# Patient Record
Sex: Male | Born: 1970 | Race: White | Hispanic: No | Marital: Married | Smoking: Never smoker
Health system: Southern US, Community
[De-identification: ages and names within clinical notes are randomized; demographics above are authoritative.]

## PROBLEM LIST (undated history)

## (undated) DIAGNOSIS — K5090 Crohn's disease, unspecified, without complications: Secondary | ICD-10-CM

## (undated) HISTORY — PX: WISDOM TOOTH EXTRACTION: SHX21

## (undated) HISTORY — PX: KNEE SURGERY: SHX244

---

## 2007-06-15 ENCOUNTER — Ambulatory Visit: Payer: Self-pay | Admitting: Emergency Medicine

## 2008-02-26 ENCOUNTER — Ambulatory Visit: Payer: Self-pay | Admitting: Unknown Physician Specialty

## 2009-01-07 ENCOUNTER — Ambulatory Visit: Payer: Self-pay | Admitting: Internal Medicine
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
## 2011-10-23 ENCOUNTER — Ambulatory Visit: Payer: Self-pay | Admitting: Family Medicine

## 2013-06-22 ENCOUNTER — Ambulatory Visit: Payer: Self-pay | Admitting: Physician Assistant

## 2013-06-22 LAB — RAPID STREP-A WITH REFLX: MICRO TEXT REPORT: NEGATIVE

## 2013-06-22 LAB — RAPID INFLUENZA A&B ANTIGENS

## 2013-06-25 LAB — BETA STREP CULTURE(ARMC)

## 2017-02-26 ENCOUNTER — Ambulatory Visit
Admission: EM | Admit: 2017-02-26 | Discharge: 2017-02-26 | Disposition: A | Discharge status: Home / Self Care | Payer: BC Managed Care – PPO | Attending: Family Medicine | Admitting: Family Medicine

## 2017-02-26 ENCOUNTER — Encounter: Payer: Self-pay | Admitting: Emergency Medicine

## 2017-02-26 DIAGNOSIS — S61213A Laceration without foreign body of left middle finger without damage to nail, initial encounter: Secondary | ICD-10-CM | POA: Diagnosis not present

## 2017-02-26 DIAGNOSIS — W271XXA Contact with garden tool, initial encounter: Secondary | ICD-10-CM

## 2017-02-26 DIAGNOSIS — Z23 Encounter for immunization: Secondary | ICD-10-CM

## 2017-02-26 MED ORDER — TETANUS-DIPHTH-ACELL PERTUSSIS 5-2.5-18.5 LF-MCG/0.5 IM SUSP
0.5000 mL | Freq: Once | INTRAMUSCULAR | Status: AC
  Administered 2017-02-26: 0.5 mL via INTRAMUSCULAR

## 2017-02-26 NOTE — ED Triage Notes (Signed)
Patient was trimming his hedges today and lacerated his left distal middle finger with the hedge trimmer.

## 2017-02-26 NOTE — ED Provider Notes (Signed)
[facility]    CSN: [id number] Arrival date & time: 02/26/17  1659     History   Chief Complaint Chief Complaint  Patient presents with  . Laceration    HPI Ivan Martin is a 46 y.o. male.   46 yo male with a c/o left middle fingertip laceration several hours ago with shears while trimming his hedges. Does not recall his last tetanus immunization.    The history is provided by the patient.    History reviewed. No pertinent past medical history.  There are no active problems to display for this patient.   Past Surgical History:  Procedure Laterality Date  . KNEE SURGERY Right   . WISDOM TOOTH EXTRACTION         Home Medications    Prior to Admission medications   Medication Sig Start Date End Date Taking? Authorizing Provider  InFLIXimab (REMICADE IV) Inject into the vein.   Yes [provider]    Family History Family History  Problem Relation Age of Onset  . Hypertension Mother     Social History Social History  Substance Use Topics  . Smoking status: Never Smoker  . Smokeless tobacco: Current User  . Alcohol use No     Allergies   Patient has no known allergies.   Review of Systems Review of Systems   Physical Exam Triage Vital Signs ED Triage Vitals  Enc Vitals Group     BP 02/26/17 1746 (!) 142/90     Pulse Rate 02/26/17 1746 (!) 106     Resp 02/26/17 1746 16     Temp 02/26/17 1746 98.1 F (36.7 C)     Temp Source 02/26/17 1746 Oral     SpO2 02/26/17 1746 98 %     Weight 02/26/17 1749 198 lb (89.8 kg)     Height 02/26/17 1749  (1.778 m)     Head Circumference --      Peak Flow --      Pain Score 02/26/17 1750 2     Pain Loc --      Pain Edu? --      Excl. in GC? --    No data found.   Updated Vital Signs BP (!) 142/90 (BP Location: Left Arm)   Pulse (!) 106   Temp 98.1 F (36.7 C) (Oral)   Resp 16   Ht  (1.778 m)   Wt 198 lb (89.8 kg)   SpO2 98%   BMI 28.41 kg/m   Visual  Acuity Right Eye Distance:   Left Eye Distance:   Bilateral Distance:    Right Eye Near:   Left Eye Near:    Bilateral Near:     Physical Exam   UC Treatments / Results  Labs (all labs ordered are listed, but only abnormal results are displayed) Labs Reviewed - No data to display  EKG  EKG Interpretation None       Radiology No results found.  Procedures .Marland KitchenLaceration Repair Date/Time: 02/26/2017 7:50 PM Performed by: Payton Mccallum Authorized by: Payton Mccallum   Consent:    Consent obtained:  Verbal   Consent given by:  Patient   Risks discussed:  Infection, need for additional repair, nerve damage, pain, poor cosmetic result, retained foreign body, tendon damage, vascular damage and poor wound healing Anesthesia (see MAR for exact dosages):    Anesthesia method:  None Laceration details:    Location:  Finger   Finger location:  L long  finger   Length (cm):  1.5 Repair type:    Repair type:  Simple Pre-procedure details:    Preparation:  Patient was prepped and draped in usual sterile fashion Exploration:    Hemostasis achieved with:  Direct pressure   Wound exploration: wound explored through full range of motion and entire depth of wound probed and visualized     Contaminated: no   Treatment:    Area cleansed with:  Betadine   Amount of cleaning:  Standard Skin repair:    Repair method:  Tissue adhesive Approximation:    Approximation:  Close Post-procedure details:    Dressing:  Open (no dressing)   Patient tolerance of procedure:  Tolerated well, no immediate complications   (including critical care time)  Medications Ordered in UC Medications  Tdap (BOOSTRIX) injection 0.5 mL (0.5 mLs Intramuscular Given 02/26/17 1830)     Initial Impression / Assessment and Plan / UC Course  I have reviewed the triage vital signs and the nursing notes.  Pertinent labs & imaging results that were available during my care of the patient were reviewed by me  and considered in my medical decision making (see chart for details).       Final Clinical Impressions(s) / UC Diagnoses   Final diagnoses:  Laceration of left middle finger without foreign body without damage to nail, initial encounter    New Prescriptions Discharge Medication List as of 02/26/2017  6:37 PM     1. diagnosis reviewed with patient 2. Laceration repair with tissue adhesive as per procedure note above; patient given tetanus vaccine 3. Recommend supportive treatment with routine wound care  4. Follow-up prn if symptoms worsen or don't improve Controlled Substance Prescriptions [place] Controlled Substance Registry consulted? Not Applicable   Payton Mccallum, MD 02/26/17 [phone]

## 2017-11-26 ENCOUNTER — Ambulatory Visit
Admission: EM | Admit: 2017-11-26 | Discharge: 2017-11-26 | Disposition: A | Discharge status: Home / Self Care | Payer: BC Managed Care – PPO | Attending: Family Medicine | Admitting: Family Medicine

## 2017-11-26 ENCOUNTER — Other Ambulatory Visit: Payer: Self-pay

## 2017-11-26 DIAGNOSIS — M79602 Pain in left arm: Secondary | ICD-10-CM

## 2017-11-26 DIAGNOSIS — L03114 Cellulitis of left upper limb: Secondary | ICD-10-CM | POA: Diagnosis not present

## 2017-11-26 HISTORY — DX: Crohn's disease, unspecified, without complications: K50.90

## 2017-11-26 MED ORDER — SULFAMETHOXAZOLE-TRIMETHOPRIM 800-160 MG PO TABS: 1.0000 | ORAL_TABLET | Freq: Two times a day (BID) | ORAL | 0 refills | Status: AC

## 2017-11-26 NOTE — ED Provider Notes (Signed)
[facility]    CSN: [id number] Arrival date & time: 11/26/17  1547     History   Chief Complaint Chief Complaint  Patient presents with  . Arm Pain    HPI Ivan Martin is a 47 y.o. male.   47 yo male with a c/o red, tender skin streaks on left upper arm since yesterday. States he had blood drawn from that arm 5 days prior to symptoms starting. Denies any fevers or chills. Patient receives Remicade for Crohn's disease.   The history is provided by the patient.  Arm Pain     Past Medical History:  Diagnosis Date  . Crohn's disease (HCC)     There are no active problems to display for this patient.   Past Surgical History:  Procedure Laterality Date  . KNEE SURGERY Right   . KNEE SURGERY    . WISDOM TOOTH EXTRACTION         Home Medications    Prior to Admission medications   Medication Sig Start Date End Date Taking? Authorizing Provider  InFLIXimab (REMICADE IV) Inject into the vein.    [provider]  sulfamethoxazole-trimethoprim (BACTRIM DS,SEPTRA DS) 800-160 MG tablet Take 1 tablet by mouth 2 (two) times daily. 11/26/17   Payton Mccallumonty, Danett Palazzo, MD    Family History Family History  Problem Relation Age of Onset  . Hypertension Mother     Social History Social History   Tobacco Use  . Smoking status: Never Smoker  . Smokeless tobacco: Never Used  Substance Use Topics  . Alcohol use: Yes    Comment: social  . Drug use: No     Allergies   Patient has no known allergies.   Review of Systems Review of Systems   Physical Exam Triage Vital Signs ED Triage Vitals  Enc Vitals Group     BP 11/26/17 1558 135/85     Pulse Rate 11/26/17 1558 79     Resp 11/26/17 1558 16     Temp 11/26/17 1558 98.2 F (36.8 C)     Temp Source 11/26/17 1558 Oral     SpO2 11/26/17 1558 98 %     Weight 11/26/17 1600 198 lb (89.8 kg)     Height 11/26/17 1600 5\' 10"  (1.778 m)     Head Circumference --      Peak Flow --      Pain Score  11/26/17 1600 0     Pain Loc --      Pain Edu? --      Excl. in GC? --    No data found.  Updated Vital Signs BP 135/85 (BP Location: Right Arm)   Pulse 79   Temp 98.2 F (36.8 C) (Oral)   Resp 16   Ht 5\' 10"  (1.778 m)   Wt 198 lb (89.8 kg)   SpO2 98%   BMI 28.41 kg/m   Visual Acuity Right Eye Distance:   Left Eye Distance:   Bilateral Distance:    Right Eye Near:   Left Eye Near:    Bilateral Near:     Physical Exam  Constitutional: He appears well-developed and well-nourished. No distress.  Skin: He is not diaphoretic. There is erythema.  2 erythematous, warm and tender skin streaks on left upper inner arm  Nursing note and vitals reviewed.    UC Treatments / Results  Labs (all labs ordered are listed, but only abnormal results are displayed) Labs Reviewed - No data to display  EKG  None  Radiology No results found.  Procedures Procedures (including critical care time)  Medications Ordered in UC Medications - No data to display  Initial Impression / Assessment and Plan / UC Course  I have reviewed the triage vital signs and the nursing notes.  Pertinent labs & imaging results that were available during my care of the patient were reviewed by me and considered in my medical decision making (see chart for details).      Final Clinical Impressions(s) / UC Diagnoses   Final diagnoses:  Cellulitis of left upper extremity   Discharge Instructions   None    ED Prescriptions    Medication Sig Dispense Auth. Provider   sulfamethoxazole-trimethoprim (BACTRIM DS,SEPTRA DS) 800-160 MG tablet Take 1 tablet by mouth 2 (two) times daily. 20 tablet Payton Mccallum, MD     1. diagnosis reviewed with patient 2. rx as per orders above; reviewed possible side effects, interactions, risks and benefits  3. Recommend supportive treatment with warm compresses to area 4. Follow-up prn if symptoms worsen or don't improve  Controlled Substance Prescriptions [place]  Controlled Substance Registry consulted? Not Applicable   Payton Mccallum, MD 11/26/17 [phone]

## 2017-11-26 NOTE — ED Triage Notes (Signed)
Pt reports he had blood drawn on Friday and then on Tuesday he noticed two red streaks in upper arm and slight tenderness
